# Patient Record
Sex: Female | Born: 1985 | Race: White | Hispanic: No | Marital: Single | Smoking: Former smoker
Health system: Southern US, Community
[De-identification: ages and names within clinical notes are randomized; demographics above are authoritative.]

---

## 2016-01-23 ENCOUNTER — Ambulatory Visit (INDEPENDENT_AMBULATORY_CARE_PROVIDER_SITE_OTHER): Payer: 59 | Admitting: Family Medicine

## 2016-01-23 ENCOUNTER — Encounter: Payer: Self-pay | Admitting: Family Medicine

## 2016-01-23 VITALS — BP 129/91 | HR 93 | Ht 62.0 in | Wt 97.0 lb

## 2016-01-23 DIAGNOSIS — Z23 Encounter for immunization: Secondary | ICD-10-CM | POA: Diagnosis not present

## 2016-01-23 DIAGNOSIS — R1012 Left upper quadrant pain: Secondary | ICD-10-CM | POA: Diagnosis not present

## 2016-01-23 DIAGNOSIS — R000 Tachycardia, unspecified: Secondary | ICD-10-CM | POA: Insufficient documentation

## 2016-01-23 MED ORDER — OMEPRAZOLE 40 MG PO CPDR: 40.0000 mg | DELAYED_RELEASE_CAPSULE | Freq: Every day | ORAL | Status: AC

## 2016-01-23 NOTE — Progress Notes (Signed)
       Cheyenne GarfinkelLeanne Orman is a 30 y.o. female who [facility] today for establish care and discuss abdominal pain. Patient notes a few week history of left upper quadrant abdominal pain. The pain occasionally radiates to the mid back into the left axilla. She denies any chest pains palpitations or shortness of breath. She has the pain does not seem related to food. She does note worsening acid reflux as well. She has not tried any medications yet. She denies any vomiting diarrhea constipation or excessive vaginal bleeding or discharge. She does note some nausea.  Additionally she notes that she uses condoms for birth control. She is interested in a more reliable and long acting birth control option.  She notes it's been quite a while since her last Pap smear   History reviewed. No pertinent past medical history. History reviewed. No pertinent past surgical history. Social History  Substance Use Topics  . Smoking status: Former Games developermoker  . Smokeless tobacco: Not on file  . Alcohol Use: 0.0 oz/week    0 Standard drinks or equivalent per week   family history includes Cancer in her paternal grandfather; Heart disease in her paternal grandmother.  ROS as above No headache, visual changes, , vomiting, diarrhea, constipation, dizziness,  skin rash, fevers, chills, night sweats, weight loss, swollen lymph nodes, body aches, joint swelling, muscle aches, chest pain, shortness of breath, mood changes, visual or auditory hallucinations.   Medications: Current Outpatient Prescriptions  Medication Sig Dispense Refill  . omeprazole (PRILOSEC) 40 MG capsule Take 1 capsule (40 mg total) by mouth daily. 30 capsule 3   No current facility-administered medications for this visit.   No Known Allergies   Exam:  BP 129/91 mmHg  Pulse 93  Ht 5\' 2"  (1.575 m)  Wt 97 lb (43.999 kg)  BMI 17.74  kg/m2 Gen: Well NAD HEENT: EOMI,  MMM Lungs: Normal work of breathing. CTABL Heart: RRR no MRG Abd: NABS, Soft. Nondistended, Mildly tender to palpation upper left quadrant without masses. No rebound or guarding Exts: Brisk capillary refill, warm and well perfused.   No results found for this or any previous visit (from the past 24 hour(s)). No results found.   Please see individual assessment and plan sections.  Tdap vaccine given prior to discharge

## 2016-01-23 NOTE — Assessment & Plan Note (Signed)
Abdominal pain. Unclear etiology. H pylori urease breath test pending. Additionally we'll obtain CBC and CMP and lipase. Empiric treatment with omeprazole. Will call patient with results if normal would consider ultrasound and subsequent referral to gastroenterology.

## 2016-01-23 NOTE — Patient Instructions (Signed)
Thank you for coming in today. We will get labs.  We should do a pap smear.  Think about birth control.   Levonorgestrel intrauterine device (IUD) What is this medicine? LEVONORGESTREL IUD (LEE voe nor jes trel) is a contraceptive (birth control) device. The device is placed inside the uterus by a healthcare professional. It is used to prevent pregnancy and can also be used to treat heavy bleeding that occurs during your period. Depending on the device, it can be used for 3 to 5 years. This medicine may be used for other purposes; ask your health care provider or pharmacist if you have questions. What should I tell my health care provider before I take this medicine? They need to know if you have any of these conditions: -abnormal Pap smear -cancer of the breast, uterus, or cervix -diabetes -endometritis -genital or pelvic infection now or in the past -have more than one sexual partner or your partner has more than one partner -heart disease -history of an ectopic or tubal pregnancy -immune system problems -IUD in place -liver disease or tumor -problems with blood clots or take blood-thinners -use intravenous drugs -uterus of unusual shape -vaginal bleeding that has not been explained -an unusual or allergic reaction to levonorgestrel, other hormones, silicone, or polyethylene, medicines, foods, dyes, or preservatives -pregnant or trying to get pregnant -breast-feeding How should I use this medicine? This device is placed inside the uterus by a health care professional. Talk to your pediatrician regarding the use of this medicine in children. Special care may be needed. Overdosage: If you think you have taken too much of this medicine contact a poison control center or emergency room at once. NOTE: This medicine is only for you. Do not share this medicine with others. What if I miss a dose? This does not apply. What may interact with this medicine? Do not take this medicine with  any of the following medications: -amprenavir -bosentan -fosamprenavir This medicine may also interact with the following medications: -aprepitant -barbiturate medicines for inducing sleep or treating seizures -bexarotene -griseofulvin -medicines to treat seizures like carbamazepine, ethotoin, felbamate, oxcarbazepine, phenytoin, topiramate -modafinil -pioglitazone -rifabutin -rifampin -rifapentine -some medicines to treat HIV infection like atazanavir, indinavir, lopinavir, nelfinavir, tipranavir, ritonavir -St. John's wort -warfarin This list may not describe all possible interactions. Give your health care provider a list of all the medicines, herbs, non-prescription drugs, or dietary supplements you use. Also tell them if you smoke, drink alcohol, or use illegal drugs. Some items may interact with your medicine. What should I watch for while using this medicine? Visit your doctor or health care professional for regular check ups. See your doctor if you or your partner has sexual contact with others, becomes HIV positive, or gets a sexual transmitted disease. This product does not protect you against HIV infection (AIDS) or other sexually transmitted diseases. You can check the placement of the IUD yourself by reaching up to the top of your vagina with clean fingers to feel the threads. Do not pull on the threads. It is a good habit to check placement after each menstrual period. Call your doctor right away if you feel more of the IUD than just the threads or if you cannot feel the threads at all. The IUD may come out by itself. You may become pregnant if the device comes out. If you notice that the IUD has come out use a backup birth control method like condoms and call your health care provider. Using tampons will not  change the position of the IUD and are okay to use during your period. What side effects may I notice from receiving this medicine? Side effects that you should report to  your doctor or health care professional as soon as possible: -allergic reactions like skin rash, itching or hives, swelling of the face, lips, or tongue -fever, flu-like symptoms -genital sores -high blood pressure -no menstrual period for 6 weeks during use -pain, swelling, warmth in the leg -pelvic pain or tenderness -severe or sudden headache -signs of pregnancy -stomach cramping -sudden shortness of breath -trouble with balance, talking, or walking -unusual vaginal bleeding, discharge -yellowing of the eyes or skin Side effects that usually do not require medical attention (report to your doctor or health care professional if they continue or are bothersome): -acne -breast pain -change in sex drive or performance -changes in weight -cramping, dizziness, or faintness while the device is being inserted -headache -irregular menstrual bleeding within first 3 to 6 months of use -nausea This list may not describe all possible side effects. Call your doctor for medical advice about side effects. You may report side effects to FDA at 1-800-FDA-1088. Where should I keep my medicine? This does not apply. NOTE: This sheet is a summary. It may not cover all possible information. If you have questions about this medicine, talk to your doctor, pharmacist, or health care provider.    2016, Elsevier/Gold Standard. (2011-10-25 13:54:04)   Intrauterine Device Information An intrauterine device (IUD) is inserted into your uterus to prevent pregnancy. There are two types of IUDs available:   Copper IUD--This type of IUD is wrapped in copper wire and is placed inside the uterus. Copper makes the uterus and fallopian tubes produce a fluid that kills sperm. The copper IUD can stay in place for 10 years.  Hormone IUD--This type of IUD contains the hormone progestin (synthetic progesterone). The hormone thickens the cervical mucus and prevents sperm from entering the uterus. It also thins the  uterine lining to prevent implantation of a fertilized egg. The hormone can weaken or kill the sperm that get into the uterus. One type of hormone IUD can stay in place for 5 years, and another type can stay in place for 3 years. Your health care provider will make sure you are a good candidate for a contraceptive IUD. Discuss with your health care provider the possible side effects.  ADVANTAGES OF AN INTRAUTERINE DEVICE  IUDs are highly effective, reversible, long acting, and low maintenance.   There are no estrogen-related side effects.   An IUD can be used when breastfeeding.   IUDs are not associated with weight gain.   The copper IUD works immediately after insertion.   The hormone IUD works right away if inserted within 7 days of your period starting. You will need to use a backup method of birth control for 7 days if the hormone IUD is inserted at any other time in your cycle.  The copper IUD does not interfere with your female hormones.   The hormone IUD can make heavy menstrual periods lighter and decrease cramping.   The hormone IUD can be used for 3 or 5 years.   The copper IUD can be used for 10 years. DISADVANTAGES OF AN INTRAUTERINE DEVICE  The hormone IUD can be associated with irregular bleeding patterns.   The copper IUD can make your menstrual flow heavier and more painful.   You may experience cramping and vaginal bleeding after insertion.    This information  is not intended to replace advice given to you by your health care provider. Make sure you discuss any questions you have with your health care provider.   Document Released: 08/28/2004 Document Revised: 05/27/2013 Document Reviewed: 03/15/2013 Elsevier Interactive Patient Education 2016 [employer].   Estradiol vaginal ring (Estring) What is this medicine? ESTRADIOL (es tra DYE ole) vaginal ring is an insert that contains a female hormone. This medicine helps relieve symptoms of vaginal  irritation and dryness that occurs in some women during menopause. This medicine may be used for other purposes; ask your health care provider or pharmacist if you have questions. What should I tell my health care provider before I take this medicine? They need to know if you have any of these conditions: -abnormal vaginal bleeding -blood vessel disease or blood clots -breast, cervical, endometrial, ovarian, liver, or uterine cancer -dementia -diabetes -gallbladder disease -heart disease or recent heart attack -high blood pressure -high cholesterol -high level of calcium in the blood -hysterectomy -kidney disease -liver disease -migraine headaches -protein C deficiency -protein S deficiency -stroke -systemic lupus erythematosus (SLE) -tobacco smoker -an unusual or allergic reaction to estrogens, other hormones, medicines, foods, dyes, or preservatives -pregnant or trying to get pregnant -breast-feeding How should I use this medicine? This medicine may be inserted by you or your physician. Follow the directions that are included with your prescription. If you are unsure how to insert the ring, contact your doctor or health care professional. The vaginal ring should remain in place for 90 days. After 90 days you should replace your old ring and insert a new one. Do not stop using except on the advice of your doctor or health care professional. Contact your pediatrician regarding the use of this medicine in children. Special care may be needed. A patient package insert for the product will be given with each prescription and refill. Read this sheet carefully each time. The sheet may change frequently. Overdosage: If you think you have taken too much of this medicine contact a poison control center or emergency room at once. NOTE: This medicine is only for you. Do not share this medicine with others. What if I miss a dose? If you miss a dose, use it as soon as you can. If it is almost  time for your next dose, use only that dose. Do not use double or extra doses. What may interact with this medicine? Do not take this medicine with any of the following medications: -aromatase inhibitors like aminoglutethimide, anastrozole, exemestane, letrozole, testolactone, vorozole This medicine may also interact with the following medications: -carbamazepine -certain antibiotics used to treat infections -certain barbiturates used for inducing sleep or treating seizures -grapefruit juice -medicines for fungus infections like itraconazole and ketoconazole -raloxifene or tamoxifen -rifabutin, rifampin, or rifapentine -ritonavir -St. John's Wort This list may not describe all possible interactions. Give your health care provider a list of all the medicines, herbs, non-prescription drugs, or dietary supplements you use. Also tell them if you smoke, drink alcohol, or use illegal drugs. Some items may interact with your medicine. What should I watch for while using this medicine? Visit your doctor or health care professional for regular checks on your progress. You will need a regular breast and pelvic exam and Pap smear while on this medicine. You should also discuss the need for regular mammograms with your health care professional, and follow his or her guidelines for these tests. This medicine can make your body retain fluid, making your fingers, hands, or  ankles swell. Your blood pressure can go up. Contact your doctor or health care professional if you feel you are retaining fluid. If you have any reason to think you are pregnant, stop taking this medicine right away and contact your doctor or health care professional. Smoking increases the risk of getting a blood clot or having a stroke while you are taking this medicine, especially if you are more than 28 years old. You are strongly advised not to smoke. If you wear contact lenses and notice visual changes, or if the lenses begin to feel  uncomfortable, consult your eye doctor or health care professional. This medicine can increase the risk of developing a condition (endometrial hyperplasia) that may lead to cancer of the lining of the uterus. Taking progestins, another hormone drug, with this medicine lowers the risk of developing this condition. Therefore, if your uterus has not been removed (by a hysterectomy), your doctor may prescribe a progestin for you to take together with your estrogen. You should know, however, that taking estrogens with progestins may have additional health risks. You should discuss the use of estrogens and progestins with your health care professional to determine the benefits and risks for you. If you are going to have surgery, you may need to stop taking this medicine. Consult your health care professional for advice before you schedule the surgery. You may bathe or participate in other activities while using this medicine. You do not need to remove the vaginal ring during sexual or other activities unless you are more comfortable doing so. Within the 90-day dosage period, you may remove the vaginal ring, rinse it with clean lukewarm (not hot or boiling) water, and re-insert the ring as needed. What side effects may I notice from receiving this medicine? Side effects that you should report to your doctor or health care professional as soon as possible: -allergic reactions like skin rash, itching or hives, swelling of the face, lips, or tongue -breast tissue changes or discharge -signs and symptoms of a blood clot such as breathing problems; changes in vision; chest pain; severe, sudden headache; pain, swelling, warmth in the leg; trouble speaking; sudden numbness or weakness of the face, arm or leg -signs and symptoms of infection like fever or chills; vomiting; diarrhea; muscle pain; dizziness; or a red, sunburn-like rash on face and body -signs and symptoms of liver injury like dark yellow or brown urine;  general ill feeling or flu-like symptoms; light-colored stools; loss of appetite; nausea; right upper belly pain; unusually weak or tired; yellowing of the eyes or skin -symptoms of bowel blockage like constipation, abdominal swelling, abdominal pain, inability to pass gas or have a bowel movement -symptoms of vaginal infection like itching, irritation or unusual discharge -unusual or increased vaginal bleeding -vaginal pain or soreness, redness, swelling Side effects that usually do not require medical attention (report to your doctor or health care professional if they continue or are bothersome): -breast tenderness -fluid retention -hair loss -headache -nausea -upset stomach -vaginal spotting This list may not describe all possible side effects. Call your doctor for medical advice about side effects. You may report side effects to FDA at 1-800-FDA-1088. Where should I keep my medicine? Keep out of the reach of children. Store at room temperature between 15 and 25 degrees C (59 and 77 degrees F). Throw away any unused medicine after the expiration date. NOTE: This sheet is a summary. It may not cover all possible information. If you have questions about this medicine, talk to  your doctor, pharmacist, or health care provider.    2016, Elsevier/Gold Standard. (2014-07-19 13:20:25)

## 2016-01-24 LAB — COMPREHENSIVE METABOLIC PANEL
ALT: 9 U/L (ref 6–29)
AST: 13 U/L (ref 10–30)
Albumin: 4.5 g/dL (ref 3.6–5.1)
Alkaline Phosphatase: 43 U/L (ref 33–115)
BUN: 12 mg/dL (ref 7–25)
CO2: 22 mmol/L (ref 20–31)
CREATININE: 0.8 mg/dL (ref 0.50–1.10)
Calcium: 9.2 mg/dL (ref 8.6–10.2)
Chloride: 103 mmol/L (ref 98–110)
Glucose, Bld: 93 mg/dL (ref 65–99)
Potassium: 4.3 mmol/L (ref 3.5–5.3)
Sodium: 139 mmol/L (ref 135–146)
Total Bilirubin: 0.4 mg/dL (ref 0.2–1.2)
Total Protein: 7.3 g/dL (ref 6.1–8.1)

## 2016-01-24 LAB — CBC
HCT: 41.5 % (ref 35.0–45.0)
Hemoglobin: 13.6 g/dL (ref 11.7–15.5)
MCH: 30.8 pg (ref 27.0–33.0)
MCHC: 32.8 g/dL (ref 32.0–36.0)
MCV: 94.1 fL (ref 80.0–100.0)
MPV: 8.9 fL (ref 7.5–12.5)
PLATELETS: 282 10*3/uL (ref 140–400)
RBC: 4.41 MIL/uL (ref 3.80–5.10)
RDW: 12.7 % (ref 11.0–15.0)
WBC: 7.5 10*3/uL (ref 3.8–10.8)

## 2016-01-24 LAB — H. PYLORI BREATH TEST: H. PYLORI BREATH TEST: NOT DETECTED

## 2016-01-24 LAB — LIPASE: Lipase: 16 U/L (ref 7–60)

## 2016-01-24 NOTE — Progress Notes (Signed)
Quick Note:  Labs are all pretty normal. If you do not get better with omeprazole we will proceed with an ultrasound. ______

## 2016-01-27 ENCOUNTER — Encounter: Payer: Self-pay | Admitting: Physician Assistant

## 2016-01-27 ENCOUNTER — Ambulatory Visit (INDEPENDENT_AMBULATORY_CARE_PROVIDER_SITE_OTHER): Payer: 59 | Admitting: Physician Assistant

## 2016-01-27 VITALS — BP 130/78 | HR 106 | Ht 62.0 in | Wt 99.0 lb

## 2016-01-27 DIAGNOSIS — R10812 Left upper quadrant abdominal tenderness: Secondary | ICD-10-CM | POA: Diagnosis not present

## 2016-01-27 DIAGNOSIS — R1012 Left upper quadrant pain: Secondary | ICD-10-CM

## 2016-01-27 MED ORDER — SUCRALFATE 1 G PO TABS: 1.0000 g | ORAL_TABLET | Freq: Three times a day (TID) | ORAL | Status: AC

## 2016-01-27 NOTE — Progress Notes (Addendum)
   Subjective:    Patient ID: Cheyenne Meyer, female    DOB: 1986/05/10, 30 y.o.   MRN: [id number]  HPI Patient is here today complaining of ongoing LUQ pain. She was seen 01/23/16 by Dr. Denyse Amassorey for this same problem. She is continuing to have LUQ and Left axilla pain that radiates to the left upper back. She describes the pain as a constant pressure with occasional sharp pain. She rates the pain at a 5 out of 10. Patient states that pain is worse with movement. She is also complaining of nausea today, but has not vomited. She has had decreased appetite throughout the week. Denies fever, constipation, diarrhea, or visible blood in stool. She has never had pain like this is the past. She has been taking the Prilosec as prescribed 4 days ago and Advil as needed for pain with no improvement.    Review of Systems  All other systems reviewed and are negative.      Objective:   Physical Exam  Constitutional: She appears well-developed and well-nourished. No distress.  HENT:  Head: Normocephalic and atraumatic.  Cardiovascular: Normal rate, regular rhythm and normal heart sounds.   Pulmonary/Chest: Effort normal and breath sounds normal. No respiratory distress. She has no wheezes.  Abdominal: Soft. Bowel sounds are normal. There is tenderness (LUQ). There is no rebound and no guarding.     Skin: Skin is warm and dry.          Assessment & Plan:  1. LUQ pain/tenderness- Patient presents with continued LUQ pain and tenderness. She has a history of gastric ulcers. Lab work done 01/23/16 revealed negative H. Pylori, normal CBC, normal CMP, and normal lipase. On PE, patient is tender to palpation just over the stomach. GI cocktail given in office today. Advised patient to keep to a bland diet and avoid antiinflammatories. Continue Omeprazole 40 mg daily and add Carafate four times daily. Will schedule abdominal ultrasound for Monday, 01/30/16.

## 2016-01-27 NOTE — Patient Instructions (Signed)
Peptic Ulcer ° °A peptic ulcer is a sore in the lining of your esophagus (esophageal ulcer), stomach (gastric ulcer), or in the first part of your small intestine (duodenal ulcer). The ulcer causes erosion into the deeper tissue. °CAUSES  °Normally, the lining of the stomach and the small intestine protects itself from the acid that digests food. The protective lining can be damaged by: °· An infection caused by a bacterium called Helicobacter pylori (H. pylori). °· Regular use of nonsteroidal anti-inflammatory drugs (NSAIDs), such as ibuprofen or aspirin. °· Smoking tobacco. °Other risk factors include being older than 50, drinking alcohol excessively, and having a family history of ulcer disease.  °SYMPTOMS  °· Burning pain or gnawing in the area between the chest and the belly button. °· Heartburn. °· Nausea and vomiting. °· Bloating. °The pain can be worse on an empty stomach and at night. If the ulcer results in bleeding, it can cause: °· Black, tarry stools. °· Vomiting of bright red blood. °· Vomiting of coffee-ground-looking materials. °DIAGNOSIS  °A diagnosis is usually made based upon your history and an exam. Other tests and procedures may be performed to find the cause of the ulcer. Finding a cause will help determine the best treatment. Tests and procedures may include: °· Blood tests, stool tests, or breath tests to check for the bacterium H. pylori. °· An upper gastrointestinal (GI) series of the esophagus, stomach, and small intestine. °· An endoscopy to examine the esophagus, stomach, and small intestine. °· A biopsy. °TREATMENT  °Treatment may include: °· Eliminating the cause of the ulcer, such as smoking, NSAIDs, or alcohol. °· Medicines to reduce the amount of acid in your digestive tract. °· Antibiotic medicines if the ulcer is caused by the H. pylori bacterium. °· An upper endoscopy to treat a bleeding ulcer. °· Surgery if the bleeding is severe or if the ulcer created a hole somewhere in the  digestive system. °HOME CARE INSTRUCTIONS  °· Avoid tobacco, alcohol, and caffeine. Smoking can increase the acid in the stomach, and continued smoking will impair the healing of ulcers. °· Avoid foods and drinks that seem to cause discomfort or aggravate your ulcer. °· Only take medicines as directed by your caregiver. Do not substitute over-the-counter medicines for prescription medicines without talking to your caregiver. °· Keep any follow-up appointments and tests as directed. °SEEK MEDICAL CARE IF:  °· Your do not improve within 7 days of starting treatment. °· You have ongoing indigestion or heartburn. °SEEK IMMEDIATE MEDICAL CARE IF:  °· You have sudden, sharp, or persistent abdominal pain. °· You have bloody or dark black, tarry stools. °· You vomit blood or vomit that looks like coffee grounds. °· You become light-headed, weak, or feel faint. °· You become sweaty or clammy. °MAKE SURE YOU:  °· Understand these instructions. °· Will watch your condition. °· Will get help right away if you are not doing well or get worse. °  °This information is not intended to replace advice given to you by your health care provider. Make sure you discuss any questions you have with your health care provider. °  °Document Released: 09/21/2000 Document Revised: 10/15/2014 Document Reviewed: 04/23/2012 °Elsevier Interactive Patient Education ©2016 [employer]. ° °

## 2016-01-30 ENCOUNTER — Ambulatory Visit (INDEPENDENT_AMBULATORY_CARE_PROVIDER_SITE_OTHER): Payer: 59

## 2016-01-30 DIAGNOSIS — R1012 Left upper quadrant pain: Secondary | ICD-10-CM

## 2017-08-31 IMAGING — US US ABDOMEN COMPLETE
1 series · 14 of 25 positions shown · non-contrast
Comparison: No prior.

CLINICAL DATA: Left upper quadrant pressure.  Nausea.

EXAM:
ABDOMEN ULTRASOUND COMPLETE

[Series 1: us abdomen complete · 0.11mm/px · 14 of 84 slices shown]
[im 1/84]
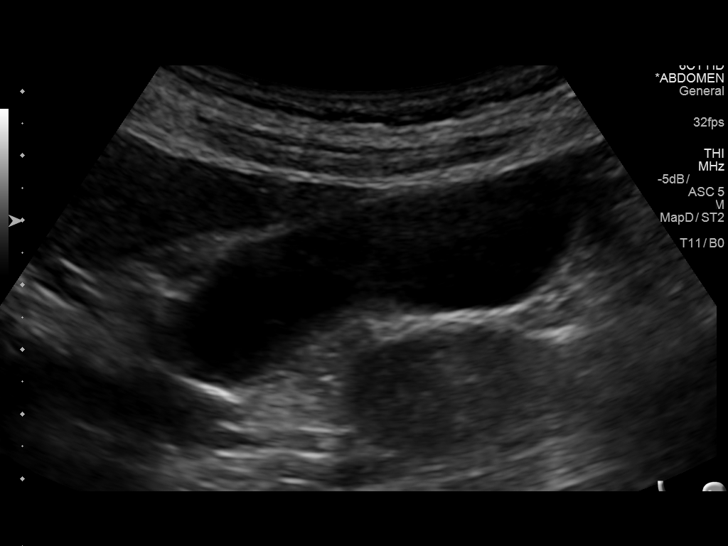
[im 7/84]
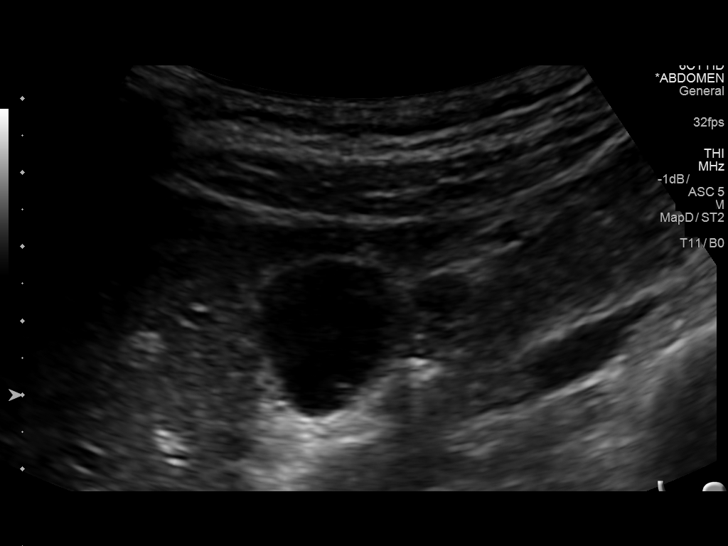
[im 14/84]
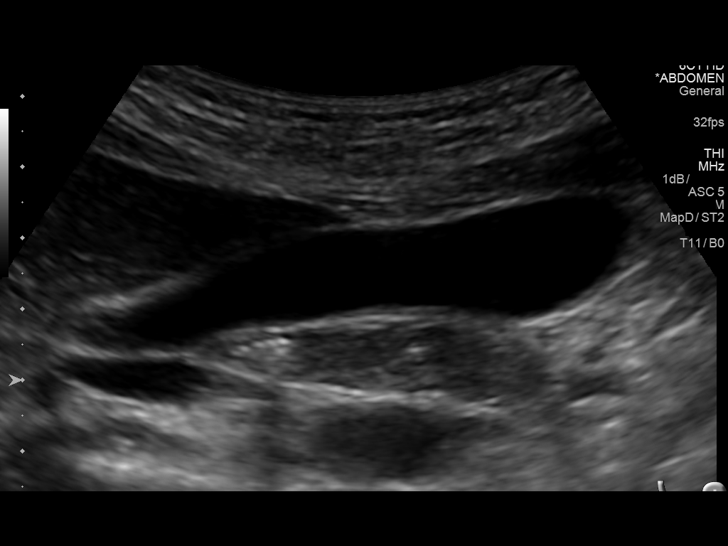
[im 21/84]
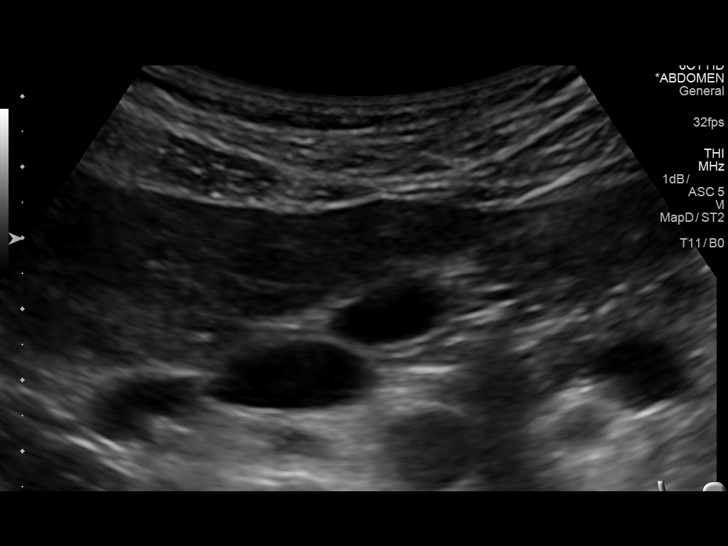
[im 28/84]
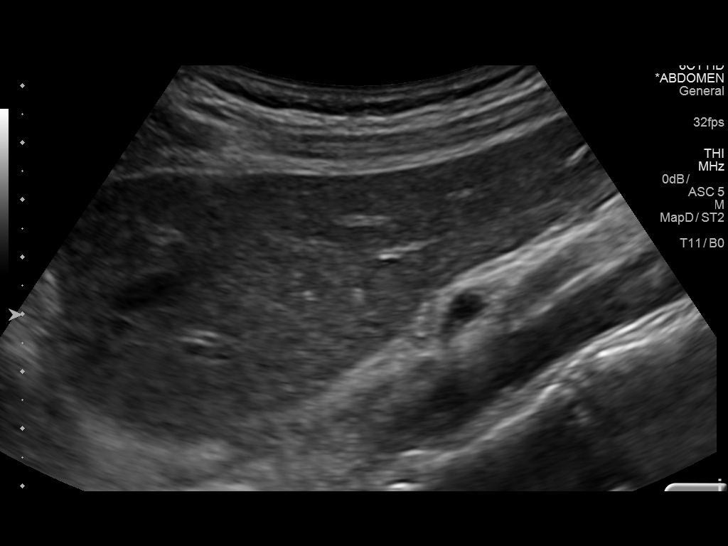
[im 32/84]
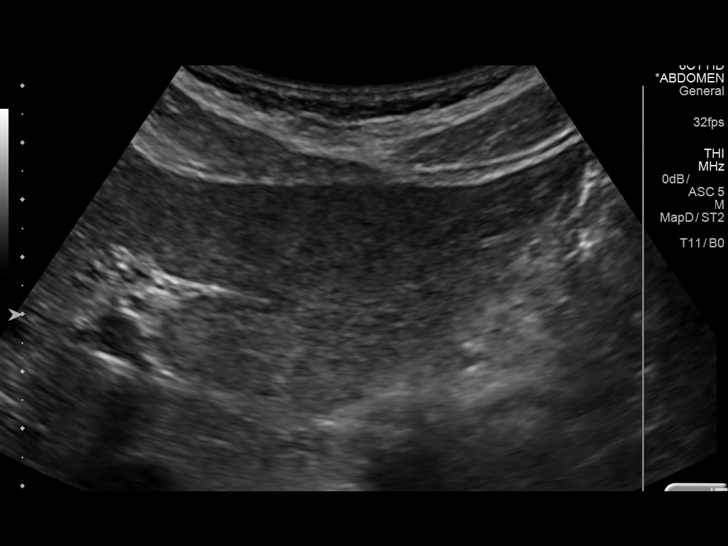
[im 39/84]
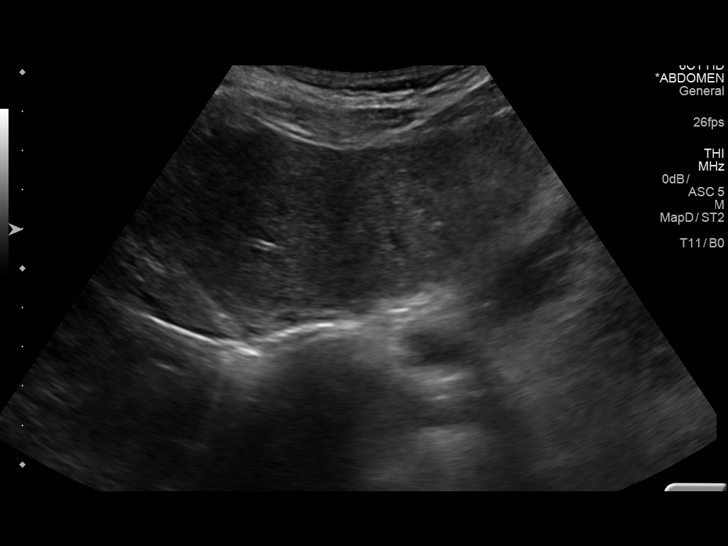
[im 45/84]
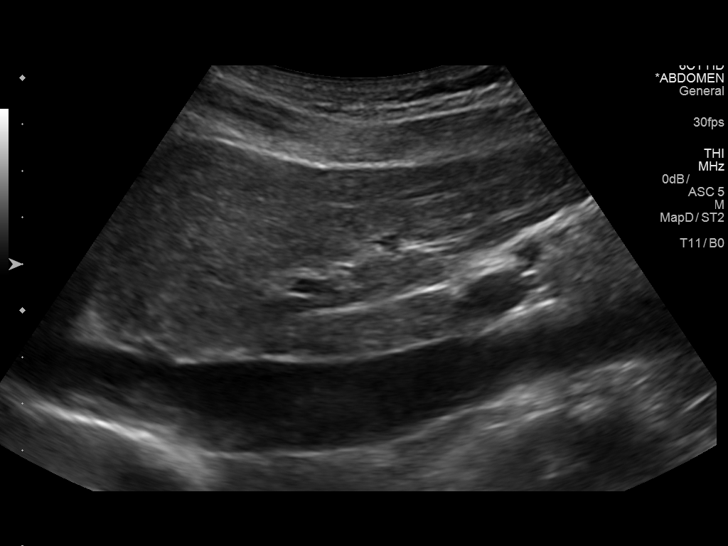
[im 52/84]
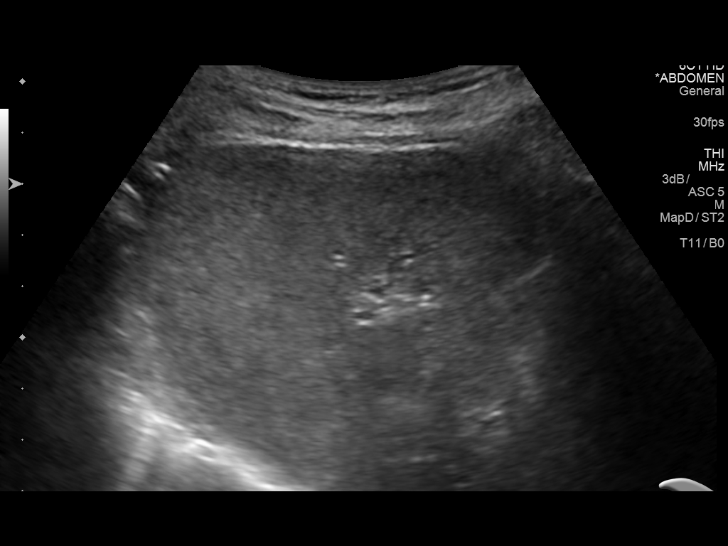
[im 56/84]
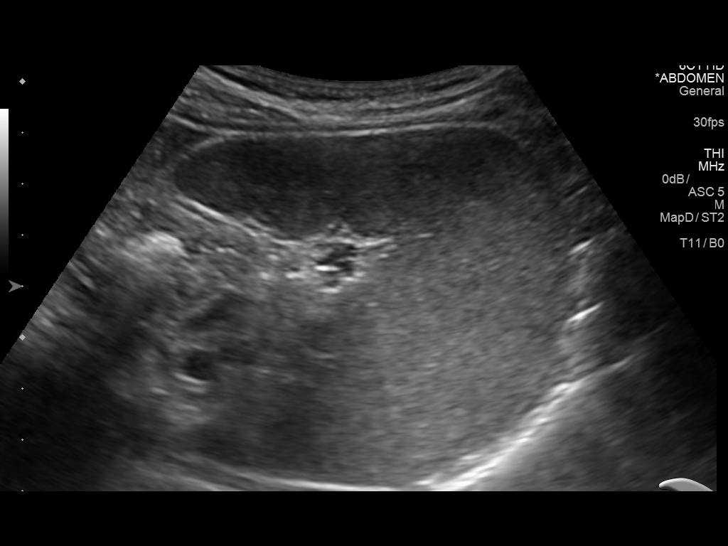
[im 63/84]
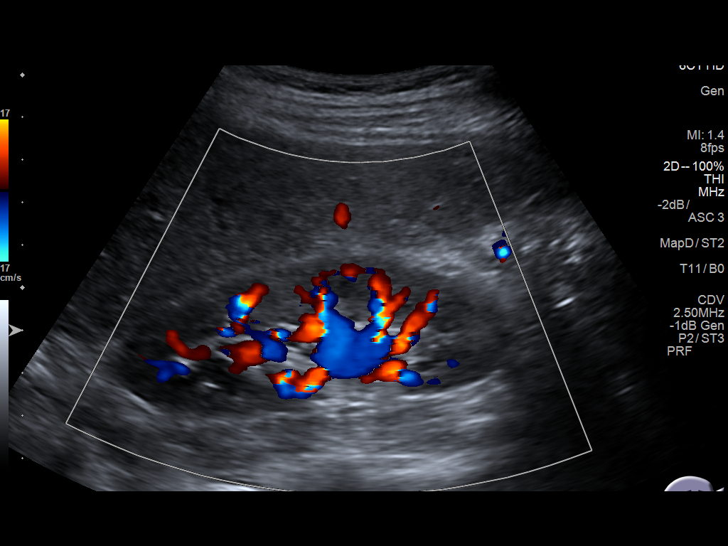
[im 70/84]
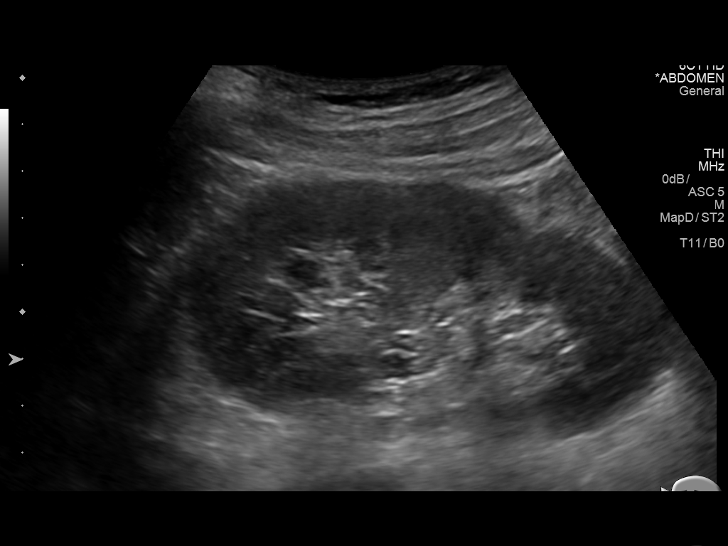
[im 77/84]
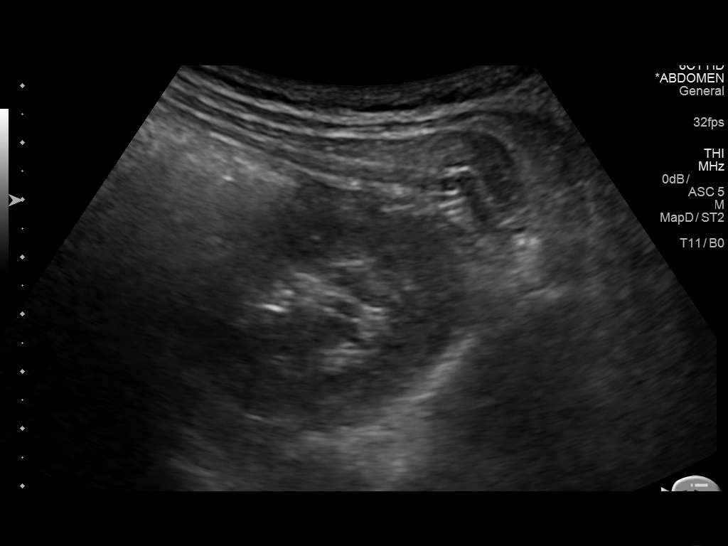
[im 84/84]
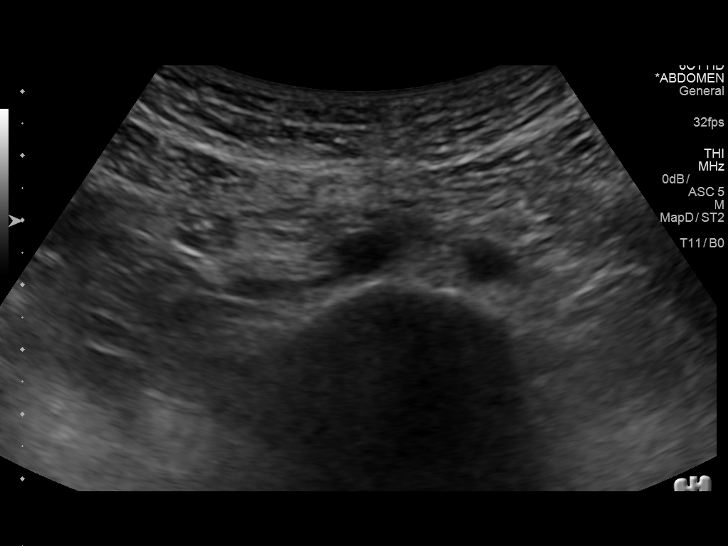

[14 of 25 positions shown; findings below may reference images not displayed]

FINDINGS: Gallbladder: No gallstones or wall thickening visualized. No
sonographic Murphy sign noted by sonographer.

Common bile duct: Diameter: 1.3 mm

Liver: No focal lesion identified. Within normal limits in
parenchymal echogenicity.

IVC: No abnormality visualized.

Pancreas: Visualized portion unremarkable.

Spleen: Size and appearance within normal limits.

Right Kidney: Length: 9.7 cm. Echogenicity within normal limits. No
mass or hydronephrosis visualized.

Left Kidney: Length: 10.3 cm. Echogenicity within normal limits. No
mass or hydronephrosis visualized.

Abdominal aorta: No aneurysm visualized.

Other findings: None.
IMPRESSION: Negative exam.

## 2018-12-05 ENCOUNTER — Telehealth: Payer: Self-pay | Admitting: Family Medicine

## 2018-12-05 NOTE — Telephone Encounter (Signed)
Outside records reviewed.
